# Patient Record
Sex: Female | Born: 1990 | Race: White | Hispanic: No | Marital: Single
Health system: Southern US, Community
[De-identification: ages and names within clinical notes are randomized; demographics above are authoritative.]

---
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
## 2008-08-14 ENCOUNTER — Ambulatory Visit: Payer: Self-pay | Admitting: Diagnostic Radiology

## 2008-08-14 ENCOUNTER — Emergency Department (HOSPITAL_BASED_OUTPATIENT_CLINIC_OR_DEPARTMENT_OTHER): Admission: EM | Admit: 2008-08-14 | Discharge: 2008-08-14 | Payer: Self-pay | Admitting: Emergency Medicine

## 2010-01-21 IMAGING — CR DG NECK SOFT TISSUE
2 series · 2 of 2 positions shown · non-contrast
Comparison: None.

CLINICAL DATA: 17-year-old female with a sensation of something
stuck in her throat and discomfort with swallowing.  Question
foreign body.

NECK SOFT TISSUES - 1+ VIEW

[w soft tissue neck ap *]
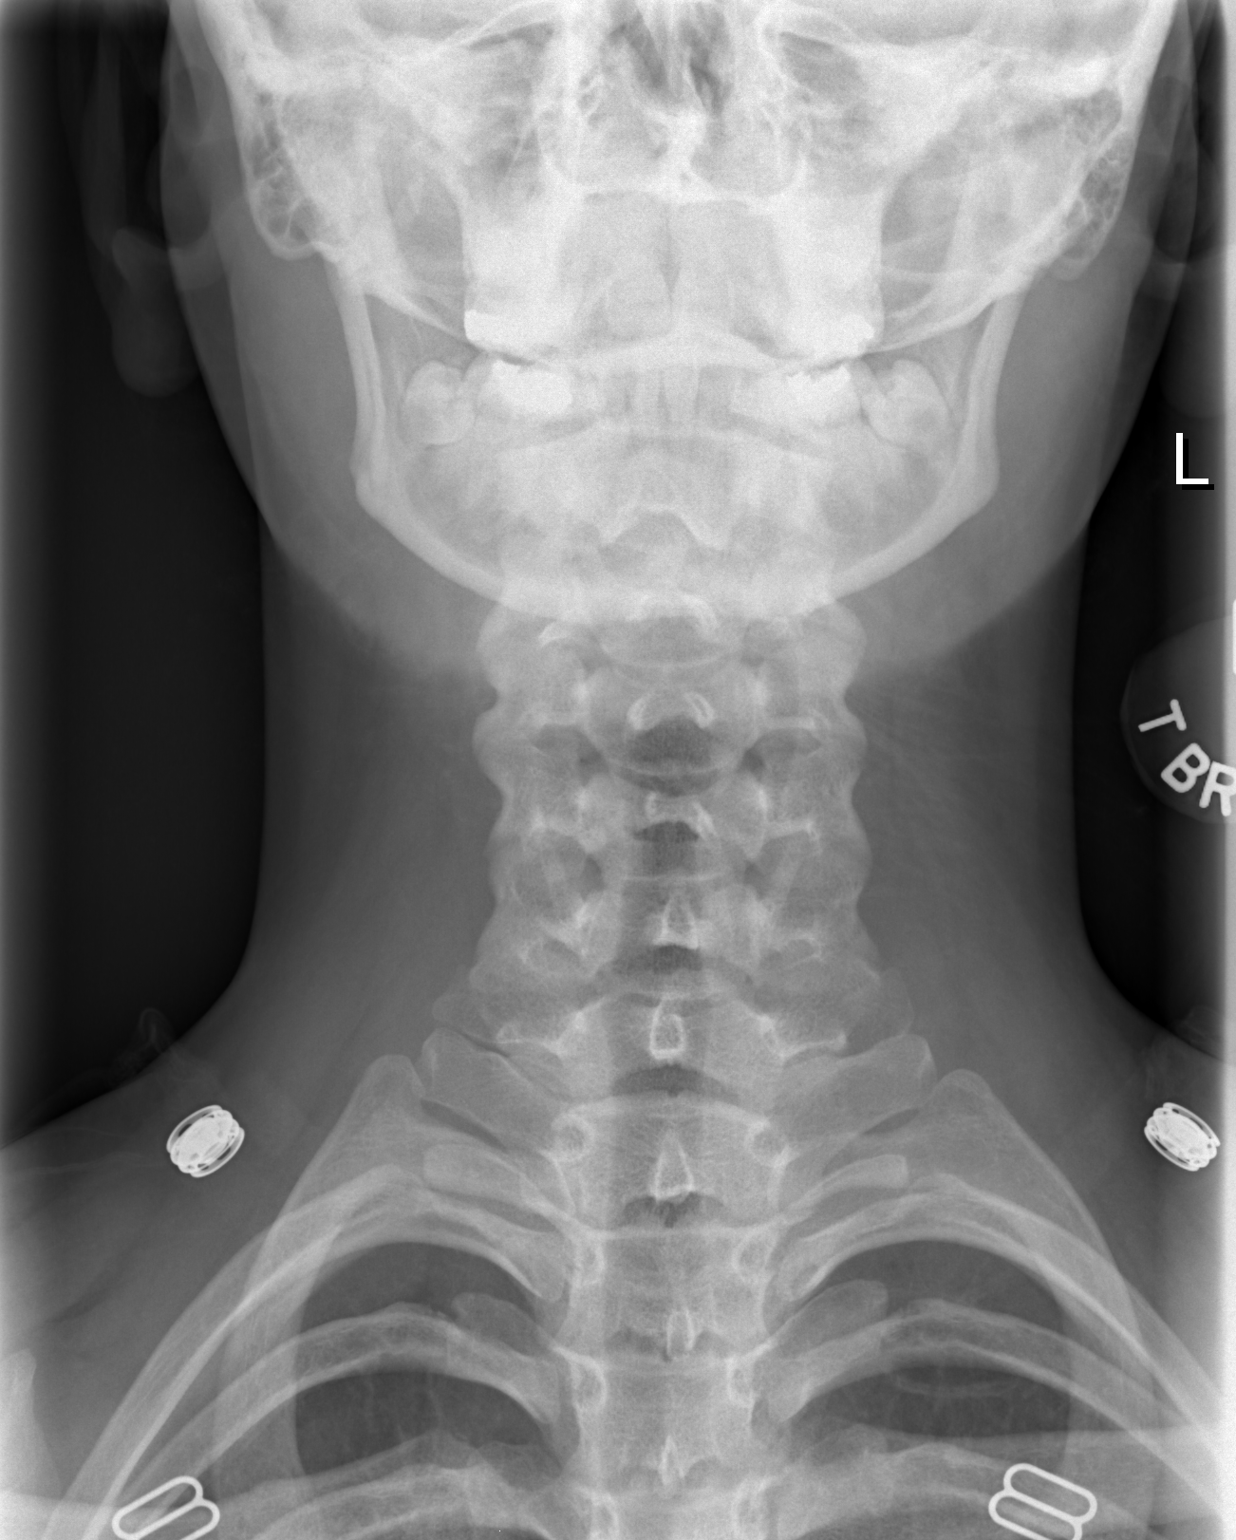

[w soft tissue neck]
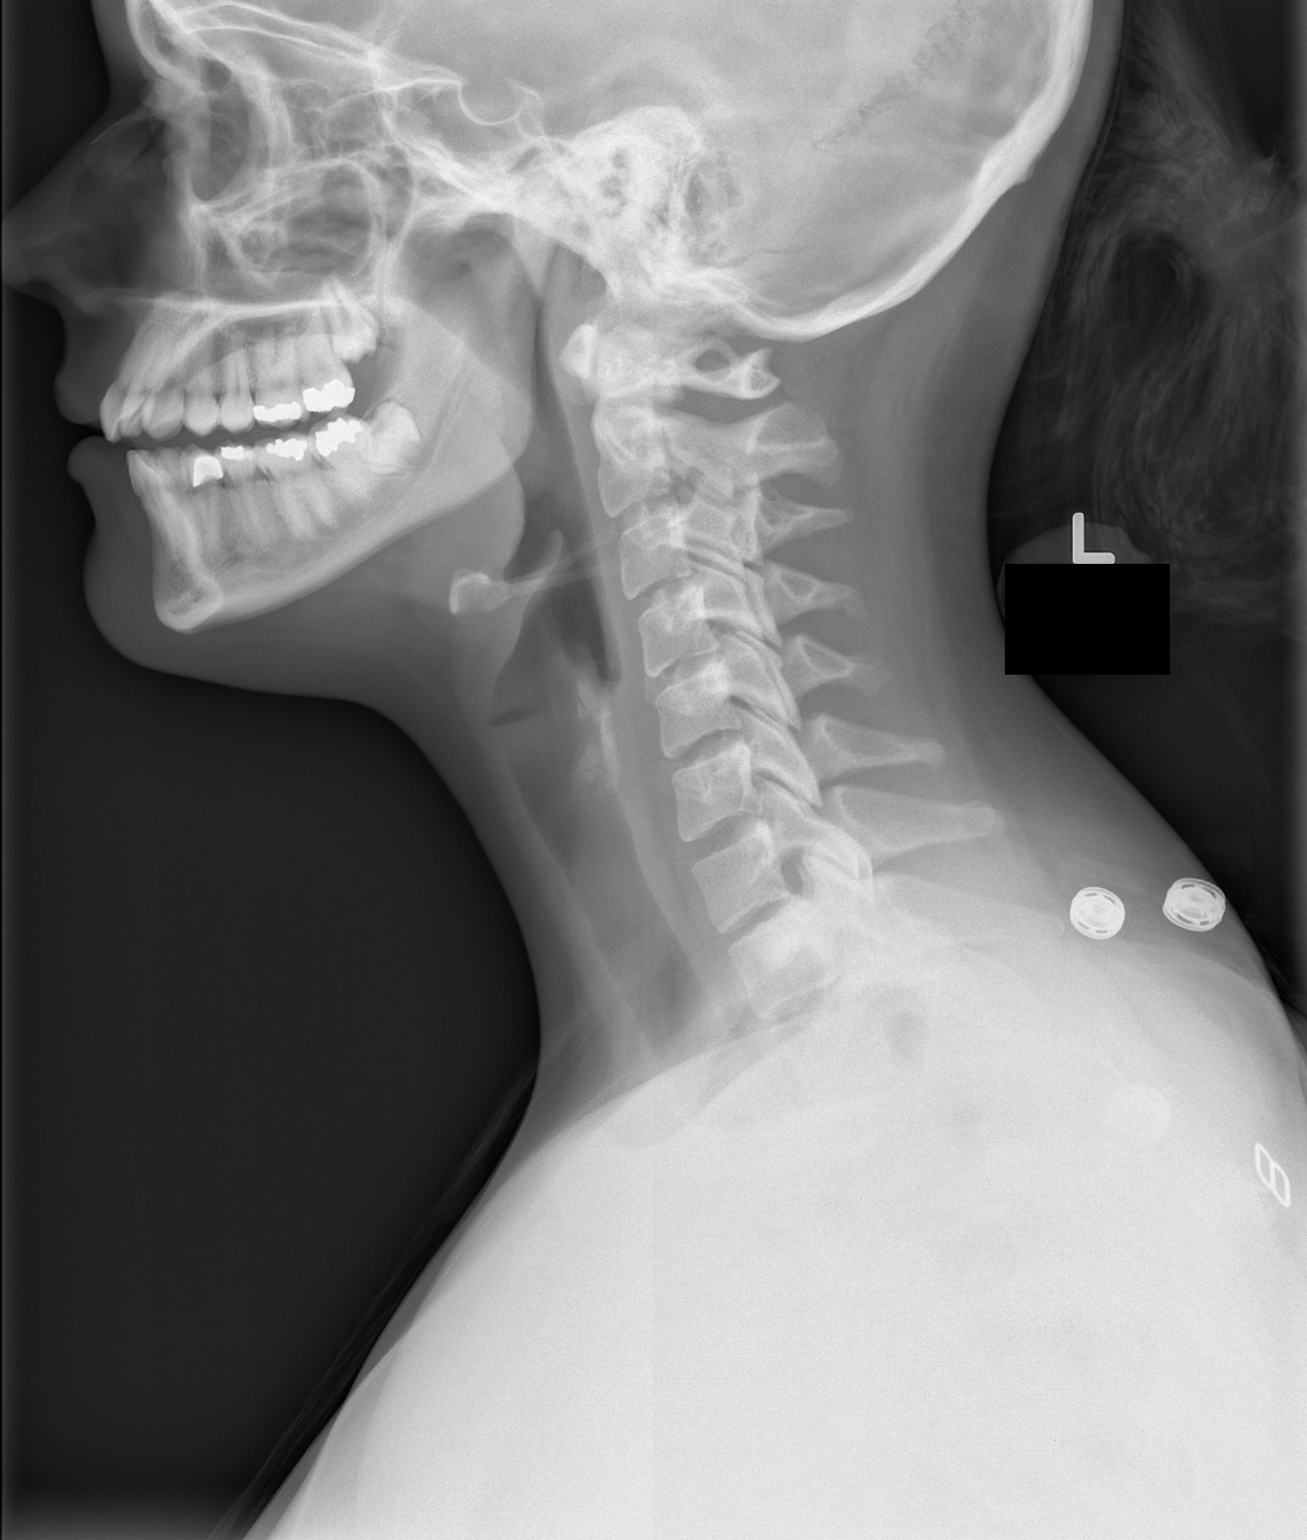

[2 of 2 positions shown; findings below may reference images not displayed]

FINDINGS: Prevertebral soft tissues are within normal limits.
Visualized osseous structures are within normal limits.  Normal
pharyngeal and hypopharyngeal contours.  The epiglottis is normal.
Laryngeal cartilages are within normal limits.  Physiologic lucency
at the level of the vocal folds.  No radiopaque foreign body
identified.  No soft tissue swelling or abnormal gas identified.
IMPRESSION: Negative neck soft tissues, no radiopaque foreign body identified.

## 2018-03-15 ENCOUNTER — Other Ambulatory Visit: Payer: Self-pay | Admitting: Obstetrics and Gynecology

## 2018-03-15 DIAGNOSIS — N6310 Unspecified lump in the right breast, unspecified quadrant: Secondary | ICD-10-CM

## 2018-03-24 ENCOUNTER — Other Ambulatory Visit: Payer: Self-pay | Admitting: Obstetrics and Gynecology

## 2018-03-24 ENCOUNTER — Ambulatory Visit
Admission: RE | Admit: 2018-03-24 | Discharge: 2018-03-24 | Disposition: A | Discharge status: Home / Self Care | Payer: BLUE CROSS/BLUE SHIELD | Source: Ambulatory Visit | Attending: Obstetrics and Gynecology | Admitting: Obstetrics and Gynecology

## 2018-03-24 DIAGNOSIS — N6310 Unspecified lump in the right breast, unspecified quadrant: Secondary | ICD-10-CM

## 2018-06-27 ENCOUNTER — Other Ambulatory Visit: Payer: BLUE CROSS/BLUE SHIELD

## 2018-09-26 ENCOUNTER — Other Ambulatory Visit: Payer: BLUE CROSS/BLUE SHIELD

## 2019-05-03 ENCOUNTER — Encounter (HOSPITAL_COMMUNITY): Payer: Self-pay | Admitting: Emergency Medicine

## 2019-05-03 ENCOUNTER — Emergency Department (HOSPITAL_COMMUNITY)
Admission: EM | Admit: 2019-05-03 | Discharge: 2019-05-03 | Disposition: A | Discharge status: Home / Self Care | Payer: No Typology Code available for payment source | Attending: Emergency Medicine | Admitting: Emergency Medicine

## 2019-05-03 ENCOUNTER — Other Ambulatory Visit: Payer: Self-pay

## 2019-05-03 DIAGNOSIS — Z79899 Other long term (current) drug therapy: Secondary | ICD-10-CM | POA: Diagnosis not present

## 2019-05-03 DIAGNOSIS — R5383 Other fatigue: Secondary | ICD-10-CM | POA: Diagnosis present

## 2019-05-03 DIAGNOSIS — U071 COVID-19: Secondary | ICD-10-CM | POA: Insufficient documentation

## 2019-05-03 LAB — I-STAT CHEM 8, ED
BUN: 11 mg/dL (ref 6–20)
Calcium, Ion: 1.19 mmol/L (ref 1.15–1.40)
Chloride: 104 mmol/L (ref 98–111)
Creatinine, Ser: 0.8 mg/dL (ref 0.44–1.00)
Glucose, Bld: 100 mg/dL — ABNORMAL HIGH (ref 70–99)
HCT: 42 % (ref 36.0–46.0)
Hemoglobin: 14.3 g/dL (ref 12.0–15.0)
Potassium: 4.1 mmol/L (ref 3.5–5.1)
Sodium: 139 mmol/L (ref 135–145)
TCO2: 22 mmol/L (ref 22–32)

## 2019-05-03 LAB — I-STAT BETA HCG BLOOD, ED (MC, WL, AP ONLY): I-stat hCG, quantitative: <5 m[IU]/mL (ref <5)

## 2019-05-03 MED ORDER — KETOROLAC TROMETHAMINE 15 MG/ML IJ SOLN
15.0000 mg | Freq: Once | INTRAMUSCULAR | Status: AC
  Administered 2019-05-03: 13:00:00 15 mg via INTRAVENOUS
  Filled 2019-05-03: qty 1

## 2019-05-03 MED ORDER — ACETAMINOPHEN 325 MG PO TABS
650.0000 mg | ORAL_TABLET | Freq: Once | ORAL | Status: AC
  Administered 2019-05-03: 650 mg via ORAL
  Filled 2019-05-03: qty 2

## 2019-05-03 MED ORDER — ALBUTEROL SULFATE HFA 108 (90 BASE) MCG/ACT IN AERS: 1.0000 | INHALATION_SPRAY | Freq: Four times a day (QID) | RESPIRATORY_TRACT | 0 refills | Status: AC | PRN

## 2019-05-03 MED ORDER — FLUTICASONE PROPIONATE 50 MCG/ACT NA SUSP: 1.0000 | Freq: Every day | NASAL | 0 refills | Status: AC

## 2019-05-03 MED ORDER — BENZONATATE 100 MG PO CAPS: 100.0000 mg | ORAL_CAPSULE | Freq: Three times a day (TID) | ORAL | 0 refills | Status: AC

## 2019-05-03 MED ORDER — SODIUM CHLORIDE 0.9 % IV BOLUS
1000.0000 mL | Freq: Once | INTRAVENOUS | Status: AC
  Administered 2019-05-03: 12:00:00 1000 mL via INTRAVENOUS

## 2019-05-03 NOTE — ED Notes (Signed)
ED Provider at bedside. 

## 2019-05-03 NOTE — Discharge Instructions (Signed)
You were seen in the emergency department today for symptoms likely related to coronavirus.  Your EKG and labs were reassuring.  Your pregnancy test was negative.  Your hemoglobin and electrolytes were normal.  We are sending home with the following medicines:  -Flonase: This is a medicine to help with nasal congestion and postnasal drip, use 1 spray per nostril daily -Tessalon: This is a medicine to help with cough use every 8 hours as needed -Albuterol inhaler: This is a medicine to help with breathing, use 1 to 2 puffs every 4-6 hours as needed for shortness of breath, wheezing, or uncontrolled coughing  We have prescribed you new medication(s) today. Discuss the medications prescribed today with your pharmacist as they can have adverse effects and interactions with your other medicines including over the counter and prescribed medications. Seek medical evaluation if you start to experience new or abnormal symptoms after taking one of these medicines, seek care immediately if you start to experience difficulty breathing, feeling of your throat closing, facial swelling, or rash as these could be indications of a more serious allergic reaction  We are instructing patient's with covid19 to quarantine themselves for 14 days. You may be able to discontinue self quarantine if the following conditions are met:   Persons with COVID-19 who have symptoms and were directed to care for themselves at home may discontinue home isolation under the  following conditions: - It has been at least 7 days have passed since symptoms first appeared. - AND at least 3 days (72 hours) have passed since recovery defined as resolution of fever without the use of fever-reducing medications and improvement in respiratory symptoms (e.g., cough, shortness of breath)  Please follow the below quarantine instructions.   Please follow up with primary care within 3-5 days for re-evaluation- call prior to going to the office to make  them aware of your symptoms. Return to the ER for new or worsening symptoms including but not limited to increased work of breathing, fever, chest pain, passing out, or any other concerns.       Person Under Monitoring Name: Ashley Garrett  Location: [address] [place] [address]   Infection Prevention Recommendations for Individuals Confirmed to have, or Being Evaluated for, 2019 Novel Coronavirus (COVID-19) Infection Who Receive Care at Home  Individuals who are confirmed to have, or are being evaluated for, COVID-19 should follow the prevention steps below until a healthcare provider or local or state health department says they can return to normal activities.  Stay home except to get medical care You should restrict activities outside your home, except for getting medical care. Do not go to work, school, or public areas, and do not use public transportation or taxis.  Call ahead before visiting your doctor Before your medical appointment, call the healthcare provider and tell them that you have, or are being evaluated for, COVID-19 infection. This will help the healthcare providers office take steps to keep other people from getting infected. Ask your healthcare provider to call the local or state health department.  Monitor your symptoms Seek prompt medical attention if your illness is worsening (e.g., difficulty breathing). Before going to your medical appointment, call the healthcare provider and tell them that you have, or are being evaluated for, COVID-19 infection. Ask your healthcare provider to call the local or state health department.  Wear a facemask You should wear a facemask that covers your nose and mouth when you are in the same room with other  people and when you visit a healthcare provider. People who live with or visit you should also wear a facemask while they are in the same room with you.  Separate yourself from other people in your home As  much as possible, you should stay in a different room from other people in your home. Also, you should use a separate bathroom, if available.  Avoid sharing household items You should not share dishes, drinking glasses, cups, eating utensils, towels, bedding, or other items with other people in your home. After using these items, you should wash them thoroughly with soap and water.  Cover your coughs and sneezes Cover your mouth and nose with a tissue when you cough or sneeze, or you can cough or sneeze into your sleeve. Throw used tissues in a lined trash can, and immediately wash your hands with soap and water for at least 20 seconds or use an alcohol-based hand rub.  Wash your [facility] your hands often and thoroughly with soap and water for at least 20 seconds. You can use an alcohol-based hand sanitizer if soap and water are not available and if your hands are not visibly dirty. Avoid touching your eyes, nose, and mouth with unwashed hands.   Prevention Steps for Caregivers and Household Members of Individuals Confirmed to have, or Being Evaluated for, COVID-19 Infection Being Cared for in the Home  If you live with, or provide care at home for, a person confirmed to have, or being evaluated for, COVID-19 infection please follow these guidelines to prevent infection:  Follow healthcare providers instructions Make sure that you understand and can help the patient follow any healthcare provider instructions for all care.  Provide for the patients basic needs You should help the patient with basic needs in the home and provide support for getting groceries, prescriptions, and other personal needs.  Monitor the patients symptoms If they are getting sicker, call his or her medical provider and tell them that the patient has, or is being evaluated for, COVID-19 infection. This will help the healthcare providers office take steps to keep other people from getting infected. Ask  the healthcare provider to call the local or state health department.  Limit the number of people who have contact with the patient If possible, have only one caregiver for the patient. Other household members should stay in another home or place of residence. If this is not possible, they should stay in another room, or be separated from the patient as much as possible. Use a separate bathroom, if available. Restrict visitors who do not have an essential need to be in the home.  Keep older adults, very young children, and other sick people away from the patient Keep older adults, very young children, and those who have compromised immune systems or chronic health conditions away from the patient. This includes people with chronic heart, lung, or kidney conditions, diabetes, and cancer.  Ensure good ventilation Make sure that shared spaces in the home have good air flow, such as from an air conditioner or an opened window, weather permitting.  Wash your hands often Wash your hands often and thoroughly with soap and water for at least 20 seconds. You can use an alcohol based hand sanitizer if soap and water are not available and if your hands are not visibly dirty. Avoid touching your eyes, nose, and mouth with unwashed hands. Use disposable paper towels to dry your hands. If not available, use dedicated cloth towels and replace them when  they become wet.  Wear a facemask and gloves Wear a disposable facemask at all times in the room and gloves when you touch or have contact with the patients blood, body fluids, and/or secretions or excretions, such as sweat, saliva, sputum, nasal mucus, vomit, urine, or feces.  Ensure the mask fits over your nose and mouth tightly, and do not touch it during use. Throw out disposable facemasks and gloves after using them. Do not reuse. Wash your hands immediately after removing your facemask and gloves. If your personal clothing becomes contaminated,  carefully remove clothing and launder. Wash your hands after handling contaminated clothing. Place all used disposable facemasks, gloves, and other waste in a lined container before disposing them with other household waste. Remove gloves and wash your hands immediately after handling these items.  Do not share dishes, glasses, or other household items with the patient Avoid sharing household items. You should not share dishes, drinking glasses, cups, eating utensils, towels, bedding, or other items with a patient who is confirmed to have, or being evaluated for, COVID-19 infection. After the person uses these items, you should wash them thoroughly with soap and water.  Wash laundry thoroughly Immediately remove and wash clothes or bedding that have blood, body fluids, and/or secretions or excretions, such as sweat, saliva, sputum, nasal mucus, vomit, urine, or feces, on them. Wear gloves when handling laundry from the patient. Read and follow directions on labels of laundry or clothing items and detergent. In general, wash and dry with the warmest temperatures recommended on the label.  Clean all areas the individual has used often Clean all touchable surfaces, such as counters, tabletops, doorknobs, bathroom fixtures, toilets, phones, keyboards, tablets, and bedside tables, every day. Also, clean any surfaces that may have blood, body fluids, and/or secretions or excretions on them. Wear gloves when cleaning surfaces the patient has come in contact with. Use a diluted bleach solution (e.g., dilute bleach with 1 part bleach and 10 parts water) or a household disinfectant with a label that says EPA-registered for coronaviruses. To make a bleach solution at home, add 1 tablespoon of bleach to 1 quart (4 cups) of water. For a larger supply, add  cup of bleach to 1 gallon (16 cups) of water. Read labels of cleaning products and follow recommendations provided on product labels. Labels contain  instructions for safe and effective use of the cleaning product including precautions you should take when applying the product, such as wearing gloves or eye protection and making sure you have good ventilation during use of the product. Remove gloves and wash hands immediately after cleaning.  Monitor yourself for signs and symptoms of illness Caregivers and household members are considered close contacts, should monitor their health, and will be asked to limit movement outside of the home to the extent possible. Follow the monitoring steps for close contacts listed on the symptom monitoring form.   ? If you have additional questions, contact your local health department or call the epidemiologist on call at [phone] (available 24/7). ? This guidance is subject to change. For the most up-to-date guidance from [facility], please refer to their website: YouBlogs.pl

## 2019-05-03 NOTE — ED Triage Notes (Signed)
Patient reports exposure to COVID with positive test results this morning. Reports feeling lightheaded. Denies symptoms prior to test. Denies N/V/D. Reports tolerating PO intake. Speaking in full sentences without difficulty.

## 2019-05-03 NOTE — ED Provider Notes (Signed)
[facility] DEPT Provider Note   CSN: [id number] Arrival date & time: 05/03/19  1025     History   Chief Complaint Chief Complaint  Patient presents with   Fatigue    HPI Ashley Garrett is a 28 y.o. female without significant past medical history who presents to the emergency department with complaints of fatigue over the past couple of days. Patient states she has felt fatigued & generally weak, worse with activity- feels a bit lightheaded at times, no other alleviating/aggravating factors to this. She has had associated productive cough of green mucous sputum, generalized body aches, subjective fever ,& chills. Had recent sick contacts w/ family who tested positive or COVID 19 over the weekend, she was tested and came back positive today.  She denies congestion, ear pain, sore throat, chest pain, dyspnea, syncope, nausea, vomiting, diarrhea, or abdominal pain.    HPI  History reviewed. No pertinent past medical history.  There are no active problems to display for this patient.   History reviewed. No pertinent surgical history.   OB History   No obstetric history on file.      Home Medications    Prior to Admission medications   Medication Sig Start Date End Date Taking? Authorizing Provider  Aspirin-Acetaminophen-Caffeine (GOODY HEADACHE PO) Take 1 packet by mouth as needed (headache/pain).   Yes [provider]  buPROPion (WELLBUTRIN XL) 150 MG 24 hr tablet Take 75 mg by mouth daily.   Yes [provider]  Multiple Vitamin (MULTIVITAMIN WITH MINERALS) TABS tablet Take 1 tablet by mouth daily.   Yes [provider]    Family History No family history on file.  Social History Social History   Tobacco Use   Smoking status: Not on file  Substance Use Topics   Alcohol use: Not on file   Drug use: Not on file     Allergies   Patient has no known allergies.   Review of Systems Review of Systems   Constitutional: Positive for chills, fatigue and fever (subjective).  HENT: Negative for congestion, ear pain and sore throat.   Respiratory: Positive for cough. Negative for shortness of breath.   Cardiovascular: Negative for chest pain.  Gastrointestinal: Negative for abdominal pain, constipation, diarrhea, nausea and vomiting.  Genitourinary: Negative for dysuria and urgency.  Neurological: Positive for weakness (generalized) and light-headedness. Negative for syncope, facial asymmetry, speech difficulty and numbness.  All other systems reviewed and are negative.    Physical Exam Updated Vital Signs BP (!) 137/91    Pulse 95    Temp 99 F (37.2 C) (Oral)    Resp 18    LMP 04/08/2019 (Approximate)    SpO2 97%   Physical Exam Vitals signs and nursing note reviewed.  Constitutional:      General: She is not in acute distress.    Appearance: She is well-developed.  HENT:     Head: Normocephalic and atraumatic.     Right Ear: Ear canal normal. Tympanic membrane is not perforated, erythematous, retracted or bulging.     Left Ear: Ear canal normal. Tympanic membrane is not perforated, erythematous, retracted or bulging.     Ears:     Comments: No mastoid erythema/swelling/tenderness.     Nose: Congestion present.     Right Sinus: No maxillary sinus tenderness or frontal sinus tenderness.     Left Sinus: No maxillary sinus tenderness or frontal sinus tenderness.     Mouth/Throat:     Pharynx: Uvula  midline. No oropharyngeal exudate or posterior oropharyngeal erythema.     Comments: Posterior oropharynx is symmetric appearing. Patient tolerating own secretions without difficulty. No trismus. No drooling. No hot potato voice. No swelling beneath the tongue, submandibular compartment is soft.  Eyes:     General:        Right eye: No discharge.        Left eye: No discharge.     Conjunctiva/sclera: Conjunctivae normal.     Pupils: Pupils are equal, round, and reactive to light.  Neck:      Musculoskeletal: Normal range of motion and neck supple. No edema or neck rigidity.  Cardiovascular:     Rate and Rhythm: Normal rate and regular rhythm.     Heart sounds: No murmur.  Pulmonary:     Effort: Pulmonary effort is normal. No respiratory distress.     Breath sounds: Normal breath sounds. No wheezing, rhonchi or rales.  Abdominal:     General: There is no distension.     Palpations: Abdomen is soft.     Tenderness: There is no abdominal tenderness. There is no guarding or rebound.  Lymphadenopathy:     Cervical: No cervical adenopathy.  Skin:    General: Skin is warm and dry.     Findings: No rash.  Neurological:     General: No focal deficit present.     Mental Status: She is alert.     Comments: Alert.  Clear speech.  CN III through XII grossly intact.  Sensation grossly intact bilateral upper and lower extremities.  5-5 symmetric grip strength and strength with plantar dorsiflexion bilaterally.  Patient is ambulatory.  Psychiatric:        Behavior: Behavior normal.      ED Treatments / Results  Labs (all labs ordered are listed, but only abnormal results are displayed) Labs Reviewed  I-STAT CHEM 8, ED - Abnormal; Notable for the following components:      Result Value   Glucose, Bld 100 (*)    All other components within normal limits  I-STAT BETA HCG BLOOD, ED ([facility], [facility], [facility] ONLY)    EKG EKG Interpretation  Date/Time:  Thursday May 03 2019 11:28:44 EDT Ventricular Rate:  92 PR Interval:    QRS Duration: 77 QT Interval:  338 QTC Calculation: 419 R Axis:   26 Text Interpretation:  Sinus rhythm Borderline T wave abnormalities No STEMI  Confirmed by Alona BeneLong, Joshua [phone]([id number]) on 05/03/2019 11:53:34 AM   Radiology No results found.  Procedures Procedures (including critical care time)  Medications Ordered in ED Medications  ketorolac (TORADOL) 15 MG/ML injection 15 mg (has no administration in time range)  acetaminophen (TYLENOL) tablet 650 mg  (650 mg Oral Given 05/03/19 1200)  sodium chloride 0.9 % bolus 1,000 mL (1,000 mLs Intravenous New Bag/Given (Non-Interop) 05/03/19 1205)     Initial Impression / Assessment and Plan / ED Course  I have reviewed the triage vital signs and the nursing notes.  Pertinent labs & imaging results that were available during my care of the patient were reviewed by me and considered in my medical decision making (see chart for details).   Patient presents to the emergency department with complaints of fatigue/generalized weakness with intermittent lightheadedness as well as cough, fever, chills, and body aches in the setting of positive COVID-19 testing.  She is nontoxic-appearing, no apparent distress, initial tachycardia normalized on my exam of the patient, BP somewhat elevated, doubt HTN emergency, vitals otherwise within normal limits.  Patient has no sinus tenderness, afebrile in the ER, do not suspect acute bacterial sinusitis.  TMs and oropharynx are clear.  No meningismus.  Lungs are clear to auscultation bilaterally, no wheezing, no focal adventitious breath sounds to indicate bacterial pneumonia, also if pneumonia present on chest x-ray would suspect viral given her positive COVID diagnosis.  She does not appear in respiratory distress, she is not hypoxic, she is not short of breath do not feel she needs admission secondary to respiratory status.  Given her complaints of generalized weakness with lightheadedness i-STAT Chem-8, EKG, and pregnancy test were obtained.  She has no significant electrolyte derangement or anemia.  She is not pregnant.  EKG and cardiac monitor without significant arrhythmias.  She is feeling better following fluids and analgesics in the emergency department.  Will discharge home with symptomatic care and quarantine instructions. I discussed results, treatment plan, need for follow-up, and return precautions with the patient. Provided opportunity for questions, patient confirmed  understanding and is in agreement with plan.   Blood pressure 132/90, pulse 75, temperature 99 F (37.2 C), temperature source Oral, resp. rate 13, last menstrual period 04/08/2019, SpO2 98 %. Final Clinical Impressions(s) / ED Diagnoses   Final diagnoses:  COVID-19    ED Discharge Orders         Ordered    fluticasone (FLONASE) 50 MCG/ACT nasal spray  Daily     05/03/19 1313    benzonatate (TESSALON) 100 MG capsule  Every 8 hours     05/03/19 1313    albuterol (VENTOLIN HFA) 108 (90 Base) MCG/ACT inhaler  Every 6 hours PRN     05/03/19 1313           Wyoma Genson, Thomson R, PA-C 05/03/19 1328    Long, Arlyss Repress, MD 05/04/19 0900

## 2019-08-31 IMAGING — US ULTRASOUND RIGHT BREAST LIMITED
1 series · 7 of 7 positions shown · non-contrast
Comparison: None.

CLINICAL DATA: Patient presents for diagnostic right breast
ultrasound due to a three-week history a palpable abnormality over
the outer midportion of the breast. No history of trauma.

EXAM:
ULTRASOUND OF THE RIGHT BREAST

[Series 1: ultrasound right breast limited · 0.08mm/px · 7 of 7 slices shown]
[im 1/7]
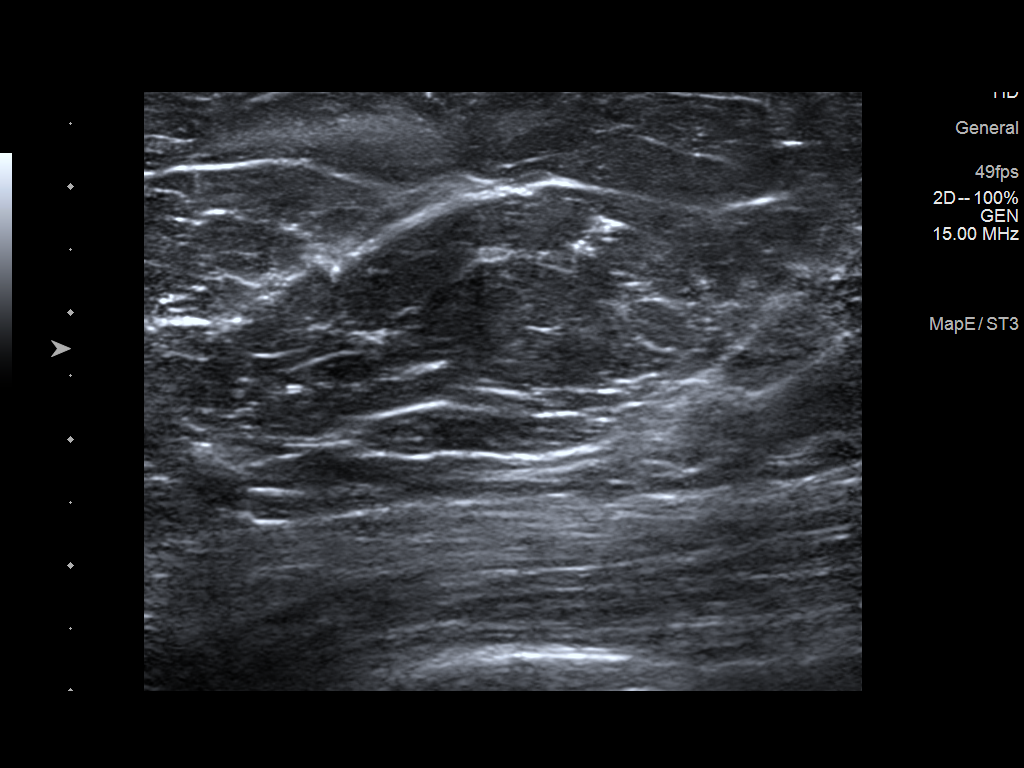
[im 2/7]
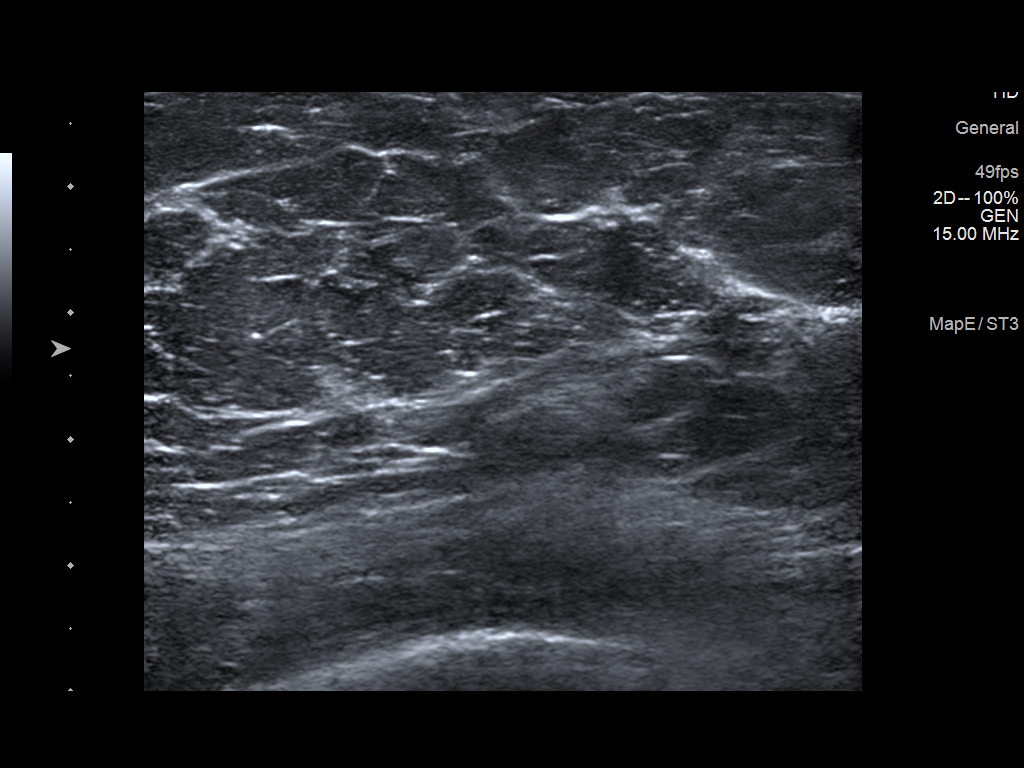
[im 3/7]
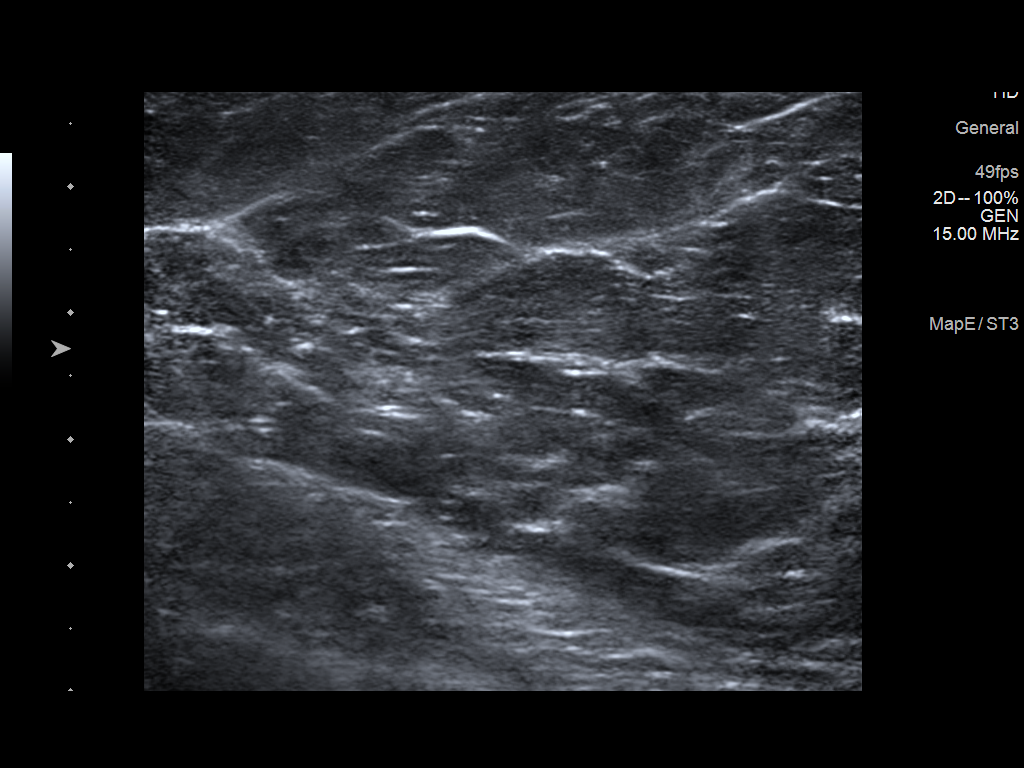
[im 4/7]
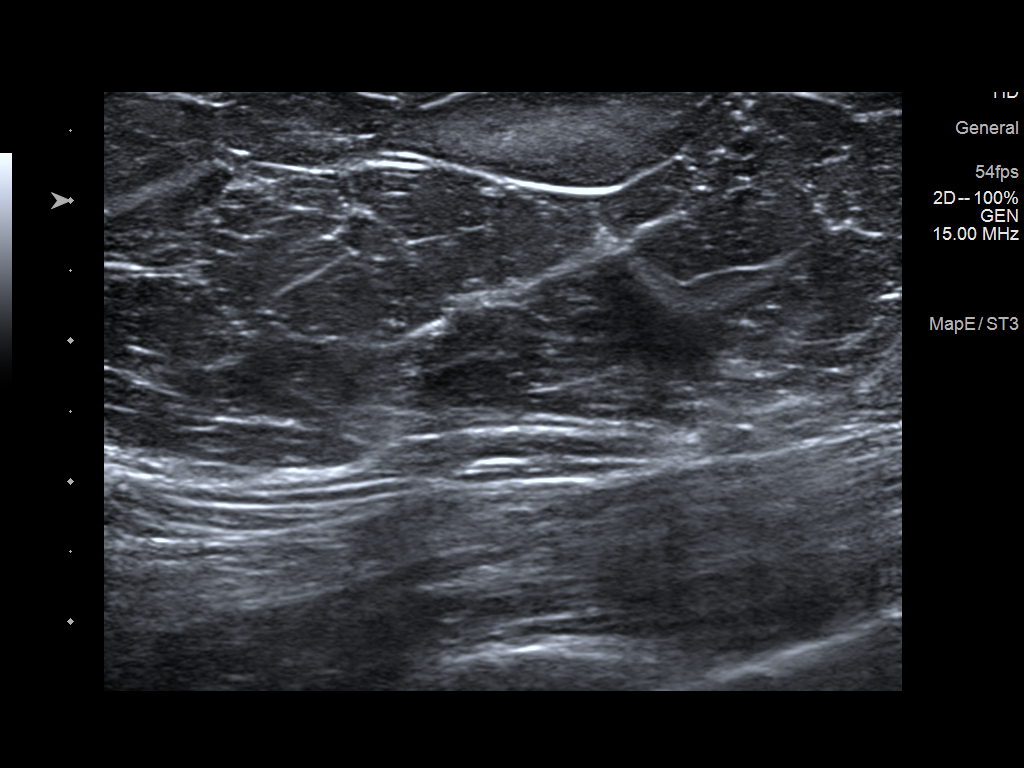
[im 5/7]
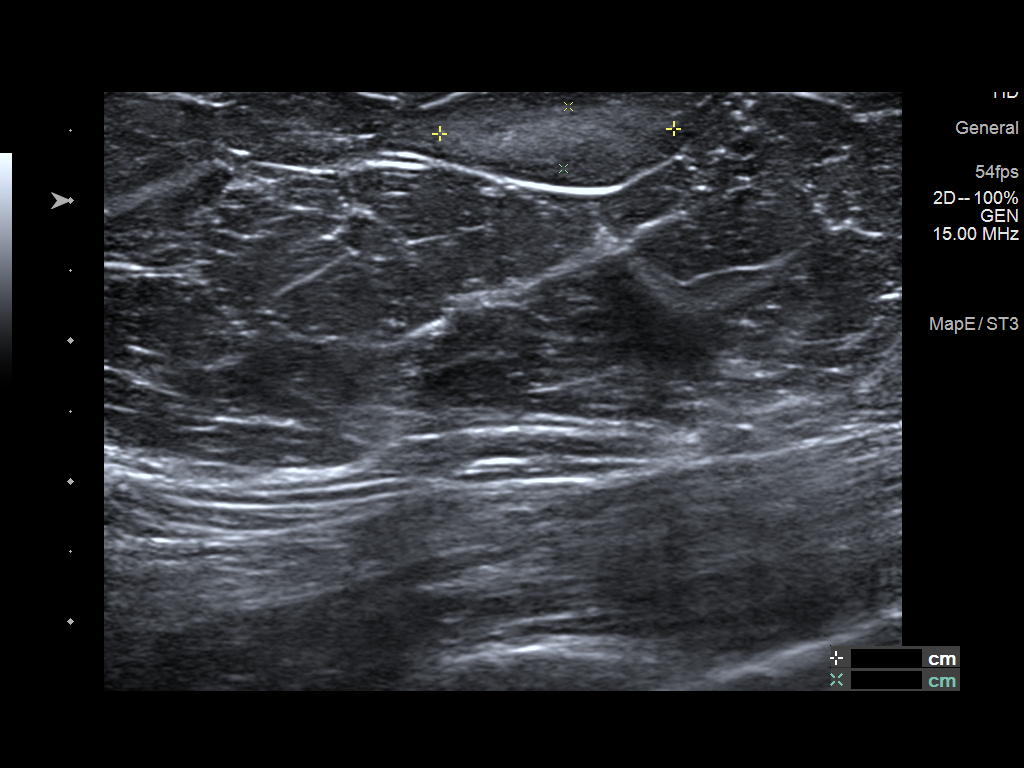
[im 6/7]
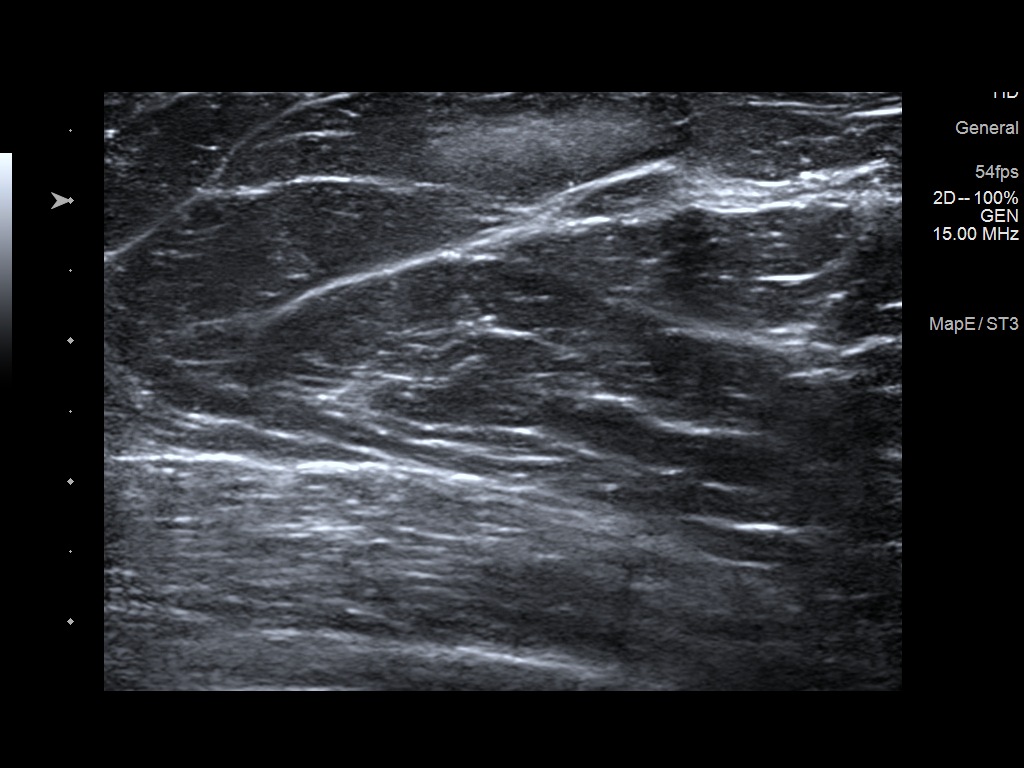
[im 7/7]
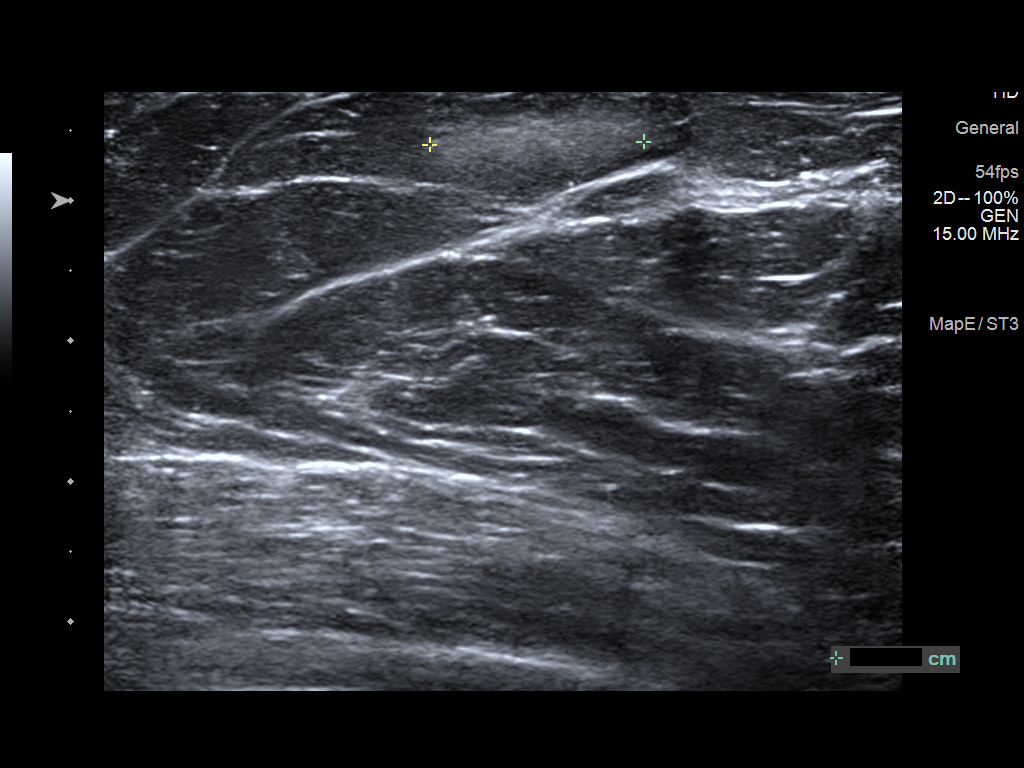

[7 of 7 positions shown; findings below may reference images not displayed]

FINDINGS: On physical exam, I palpate mild nodularity over the outer
midportion of the right breast without discrete focal mass.

Targeted ultrasound is performed, showing an oval hyperechoic focus
just below the skin with somewhat indistinct borders at the 9
o'clock position of the right breast 5-6 cm from the nipple
measuring 0.4 x 1.5 x 1.7 cm corresponding to the region of
patient's palpable abnormality. This likely represents a benign
process such as fat necrosis and less likely lipoma. Rare
angiosarcoma would be highly unlikely.
IMPRESSION: Probable benign abnormality over the 9 o'clock position of the right
breast 5 cm from the nipple measuring 0.4 x 1.5 x 1.7 cm likely fat
necrosis.

RECOMMENDATION:
Recommend three-month follow-up diagnostic right breast ultrasound
to document stability/resolution.

I have discussed the findings and recommendations with the patient.
Results were also provided in writing at the conclusion of the
visit. If applicable, a reminder letter will be sent to the patient
regarding the next appointment.

BI-RADS CATEGORY  3: Probably benign.
